# Patient Record
Sex: Female | Born: 1960 | Race: White | Hispanic: No | Marital: Single | Smoking: Never smoker
Health system: Southern US, Community
[De-identification: ages and names within clinical notes are randomized; demographics above are authoritative.]

---

## 2000-08-28 ENCOUNTER — Other Ambulatory Visit: Admission: RE | Admit: 2000-08-28 | Discharge: 2000-08-28 | Payer: Self-pay | Admitting: Obstetrics and Gynecology

## 2001-12-19 ENCOUNTER — Encounter: Payer: Self-pay | Admitting: Obstetrics and Gynecology

## 2001-12-19 ENCOUNTER — Encounter: Admission: RE | Admit: 2001-12-19 | Discharge: 2001-12-19 | Payer: Self-pay | Admitting: Obstetrics and Gynecology

## 2002-12-08 ENCOUNTER — Ambulatory Visit (HOSPITAL_COMMUNITY): Admission: RE | Admit: 2002-12-08 | Discharge: 2002-12-08 | Payer: Self-pay | Admitting: Obstetrics and Gynecology

## 2003-01-01 ENCOUNTER — Encounter: Admission: RE | Admit: 2003-01-01 | Discharge: 2003-01-01 | Payer: Self-pay | Admitting: Obstetrics and Gynecology

## 2003-09-29 ENCOUNTER — Other Ambulatory Visit: Admission: RE | Admit: 2003-09-29 | Discharge: 2003-09-29 | Payer: Self-pay | Admitting: Obstetrics and Gynecology

## 2004-01-21 ENCOUNTER — Encounter: Admission: RE | Admit: 2004-01-21 | Discharge: 2004-01-21 | Payer: Self-pay | Admitting: Obstetrics and Gynecology

## 2004-10-25 ENCOUNTER — Other Ambulatory Visit: Admission: RE | Admit: 2004-10-25 | Discharge: 2004-10-25 | Payer: Self-pay | Admitting: Obstetrics and Gynecology

## 2005-01-24 ENCOUNTER — Encounter: Admission: RE | Admit: 2005-01-24 | Discharge: 2005-01-24 | Payer: Self-pay | Admitting: Obstetrics and Gynecology

## 2006-01-26 ENCOUNTER — Encounter: Admission: RE | Admit: 2006-01-26 | Discharge: 2006-01-26 | Payer: Self-pay | Admitting: Obstetrics and Gynecology

## 2007-01-29 ENCOUNTER — Encounter: Admission: RE | Admit: 2007-01-29 | Discharge: 2007-01-29 | Payer: Self-pay | Admitting: Obstetrics and Gynecology

## 2008-02-18 ENCOUNTER — Encounter: Admission: RE | Admit: 2008-02-18 | Discharge: 2008-02-18 | Payer: Self-pay | Admitting: Obstetrics and Gynecology

## 2009-04-27 ENCOUNTER — Encounter: Admission: RE | Admit: 2009-04-27 | Discharge: 2009-04-27 | Payer: Self-pay | Admitting: Obstetrics and Gynecology

## 2009-04-28 ENCOUNTER — Other Ambulatory Visit: Admission: RE | Admit: 2009-04-28 | Discharge: 2009-04-28 | Payer: Self-pay | Admitting: Obstetrics and Gynecology

## 2010-01-23 ENCOUNTER — Encounter: Payer: Self-pay | Admitting: Obstetrics and Gynecology

## 2010-03-22 ENCOUNTER — Other Ambulatory Visit: Payer: Self-pay | Admitting: Obstetrics and Gynecology

## 2010-03-22 DIAGNOSIS — Z1231 Encounter for screening mammogram for malignant neoplasm of breast: Secondary | ICD-10-CM

## 2010-05-03 ENCOUNTER — Ambulatory Visit
Admission: RE | Admit: 2010-05-03 | Discharge: 2010-05-03 | Disposition: A | Discharge status: Home / Self Care | Payer: BC Managed Care – PPO | Source: Ambulatory Visit | Attending: Obstetrics and Gynecology | Admitting: Obstetrics and Gynecology

## 2010-05-03 DIAGNOSIS — Z1231 Encounter for screening mammogram for malignant neoplasm of breast: Secondary | ICD-10-CM

## 2011-06-28 ENCOUNTER — Other Ambulatory Visit: Payer: Self-pay | Admitting: *Deleted

## 2011-06-28 ENCOUNTER — Other Ambulatory Visit: Payer: Self-pay | Admitting: Obstetrics and Gynecology

## 2011-06-28 DIAGNOSIS — Z1231 Encounter for screening mammogram for malignant neoplasm of breast: Secondary | ICD-10-CM

## 2011-07-27 ENCOUNTER — Ambulatory Visit
Admission: RE | Admit: 2011-07-27 | Discharge: 2011-07-27 | Disposition: A | Discharge status: Home / Self Care | Payer: BC Managed Care – PPO | Source: Ambulatory Visit | Attending: Obstetrics and Gynecology | Admitting: Obstetrics and Gynecology

## 2011-07-27 DIAGNOSIS — Z1231 Encounter for screening mammogram for malignant neoplasm of breast: Secondary | ICD-10-CM

## 2012-08-28 ENCOUNTER — Other Ambulatory Visit: Payer: Self-pay

## 2012-08-28 DIAGNOSIS — Z1231 Encounter for screening mammogram for malignant neoplasm of breast: Secondary | ICD-10-CM

## 2012-09-20 ENCOUNTER — Ambulatory Visit: Payer: BC Managed Care – PPO

## 2012-09-25 ENCOUNTER — Ambulatory Visit
Admission: RE | Admit: 2012-09-25 | Discharge: 2012-09-25 | Disposition: A | Discharge status: Home / Self Care | Payer: BC Managed Care – PPO | Source: Ambulatory Visit

## 2012-09-25 DIAGNOSIS — Z1231 Encounter for screening mammogram for malignant neoplasm of breast: Secondary | ICD-10-CM

## 2013-11-04 ENCOUNTER — Other Ambulatory Visit: Payer: Self-pay

## 2013-11-04 DIAGNOSIS — Z1231 Encounter for screening mammogram for malignant neoplasm of breast: Secondary | ICD-10-CM

## 2013-11-20 ENCOUNTER — Ambulatory Visit
Admission: RE | Admit: 2013-11-20 | Discharge: 2013-11-20 | Disposition: A | Discharge status: Home / Self Care | Payer: BC Managed Care – PPO | Source: Ambulatory Visit

## 2013-11-20 ENCOUNTER — Encounter (INDEPENDENT_AMBULATORY_CARE_PROVIDER_SITE_OTHER): Payer: Self-pay

## 2013-11-20 DIAGNOSIS — Z1231 Encounter for screening mammogram for malignant neoplasm of breast: Secondary | ICD-10-CM

## 2014-01-06 ENCOUNTER — Other Ambulatory Visit: Payer: Self-pay | Admitting: Dermatology

## 2014-11-12 ENCOUNTER — Other Ambulatory Visit: Payer: Self-pay

## 2014-11-12 DIAGNOSIS — Z1231 Encounter for screening mammogram for malignant neoplasm of breast: Secondary | ICD-10-CM

## 2015-01-05 ENCOUNTER — Ambulatory Visit
Admission: RE | Admit: 2015-01-05 | Discharge: 2015-01-05 | Disposition: A | Discharge status: Home / Self Care | Payer: BLUE CROSS/BLUE SHIELD | Source: Ambulatory Visit

## 2015-01-05 DIAGNOSIS — Z1231 Encounter for screening mammogram for malignant neoplasm of breast: Secondary | ICD-10-CM

## 2015-12-08 ENCOUNTER — Other Ambulatory Visit: Payer: Self-pay | Admitting: *Deleted

## 2015-12-08 DIAGNOSIS — Z1231 Encounter for screening mammogram for malignant neoplasm of breast: Secondary | ICD-10-CM

## 2016-01-10 ENCOUNTER — Ambulatory Visit
Admission: RE | Admit: 2016-01-10 | Discharge: 2016-01-10 | Disposition: A | Discharge status: Home / Self Care | Payer: BLUE CROSS/BLUE SHIELD | Source: Ambulatory Visit | Attending: *Deleted | Admitting: *Deleted

## 2016-01-10 DIAGNOSIS — Z1231 Encounter for screening mammogram for malignant neoplasm of breast: Secondary | ICD-10-CM

## 2017-02-12 ENCOUNTER — Other Ambulatory Visit: Payer: Self-pay | Admitting: *Deleted

## 2017-02-12 DIAGNOSIS — Z139 Encounter for screening, unspecified: Secondary | ICD-10-CM

## 2017-04-24 ENCOUNTER — Ambulatory Visit
Admission: RE | Admit: 2017-04-24 | Discharge: 2017-04-24 | Disposition: A | Discharge status: Home / Self Care | Payer: BLUE CROSS/BLUE SHIELD | Source: Ambulatory Visit | Attending: *Deleted | Admitting: *Deleted

## 2017-04-24 DIAGNOSIS — Z139 Encounter for screening, unspecified: Secondary | ICD-10-CM

## 2018-09-06 ENCOUNTER — Other Ambulatory Visit: Payer: Self-pay | Admitting: Obstetrics & Gynecology

## 2018-09-06 DIAGNOSIS — Z1231 Encounter for screening mammogram for malignant neoplasm of breast: Secondary | ICD-10-CM

## 2018-11-05 ENCOUNTER — Other Ambulatory Visit: Payer: Self-pay

## 2018-11-05 ENCOUNTER — Ambulatory Visit
Admission: RE | Admit: 2018-11-05 | Discharge: 2018-11-05 | Disposition: A | Discharge status: Home / Self Care | Payer: BC Managed Care – PPO | Source: Ambulatory Visit | Attending: Obstetrics & Gynecology | Admitting: Obstetrics & Gynecology

## 2018-11-05 DIAGNOSIS — Z1231 Encounter for screening mammogram for malignant neoplasm of breast: Secondary | ICD-10-CM

## 2019-12-15 ENCOUNTER — Other Ambulatory Visit: Payer: Self-pay | Admitting: Obstetrics & Gynecology

## 2019-12-15 DIAGNOSIS — Z1231 Encounter for screening mammogram for malignant neoplasm of breast: Secondary | ICD-10-CM

## 2020-01-26 ENCOUNTER — Ambulatory Visit: Payer: BC Managed Care – PPO

## 2020-02-05 ENCOUNTER — Ambulatory Visit: Payer: BC Managed Care – PPO

## 2020-03-14 ENCOUNTER — Inpatient Hospital Stay (HOSPITAL_BASED_OUTPATIENT_CLINIC_OR_DEPARTMENT_OTHER)
Admission: EM | Admit: 2020-03-14 | Discharge: 2020-03-16 | DRG: 605 | Disposition: A | Discharge status: Home / Self Care | Payer: BC Managed Care – PPO | Attending: Internal Medicine | Admitting: Internal Medicine

## 2020-03-14 ENCOUNTER — Other Ambulatory Visit: Payer: Self-pay

## 2020-03-14 ENCOUNTER — Emergency Department (HOSPITAL_BASED_OUTPATIENT_CLINIC_OR_DEPARTMENT_OTHER): Payer: BC Managed Care – PPO | Admitting: Radiology

## 2020-03-14 ENCOUNTER — Encounter (HOSPITAL_BASED_OUTPATIENT_CLINIC_OR_DEPARTMENT_OTHER): Payer: Self-pay | Admitting: Obstetrics and Gynecology

## 2020-03-14 DIAGNOSIS — W5501XA Bitten by cat, initial encounter: Secondary | ICD-10-CM | POA: Diagnosis not present

## 2020-03-14 DIAGNOSIS — L03114 Cellulitis of left upper limb: Secondary | ICD-10-CM | POA: Diagnosis not present

## 2020-03-14 DIAGNOSIS — D696 Thrombocytopenia, unspecified: Secondary | ICD-10-CM | POA: Diagnosis present

## 2020-03-14 DIAGNOSIS — S61451A Open bite of right hand, initial encounter: Secondary | ICD-10-CM | POA: Diagnosis present

## 2020-03-14 DIAGNOSIS — S61459A Open bite of unspecified hand, initial encounter: Secondary | ICD-10-CM

## 2020-03-14 DIAGNOSIS — L03116 Cellulitis of left lower limb: Secondary | ICD-10-CM | POA: Diagnosis present

## 2020-03-14 DIAGNOSIS — Z20822 Contact with and (suspected) exposure to covid-19: Secondary | ICD-10-CM | POA: Diagnosis present

## 2020-03-14 DIAGNOSIS — R197 Diarrhea, unspecified: Secondary | ICD-10-CM | POA: Diagnosis not present

## 2020-03-14 DIAGNOSIS — D72829 Elevated white blood cell count, unspecified: Secondary | ICD-10-CM | POA: Diagnosis present

## 2020-03-14 DIAGNOSIS — Z9109 Other allergy status, other than to drugs and biological substances: Secondary | ICD-10-CM

## 2020-03-14 DIAGNOSIS — S61452A Open bite of left hand, initial encounter: Secondary | ICD-10-CM | POA: Diagnosis not present

## 2020-03-14 DIAGNOSIS — E669 Obesity, unspecified: Secondary | ICD-10-CM | POA: Diagnosis present

## 2020-03-14 DIAGNOSIS — Z885 Allergy status to narcotic agent status: Secondary | ICD-10-CM | POA: Diagnosis not present

## 2020-03-14 DIAGNOSIS — L03115 Cellulitis of right lower limb: Secondary | ICD-10-CM | POA: Diagnosis present

## 2020-03-14 DIAGNOSIS — Z6835 Body mass index (BMI) 35.0-35.9, adult: Secondary | ICD-10-CM | POA: Diagnosis not present

## 2020-03-14 LAB — CBC WITH DIFFERENTIAL/PLATELET
Abs Immature Granulocytes: 0.03 10*3/uL (ref 0.00–0.07)
Basophils Absolute: 0.1 10*3/uL (ref 0.0–0.1)
Basophils Relative: 1 %
Eosinophils Absolute: 0.2 10*3/uL (ref 0.0–0.5)
Eosinophils Relative: 2 %
HCT: 41.8 % (ref 36.0–46.0)
Hemoglobin: 13.7 g/dL (ref 12.0–15.0)
Immature Granulocytes: 0 %
Lymphocytes Relative: 23 %
Lymphs Abs: 2.7 10*3/uL (ref 0.7–4.0)
MCH: 28.5 pg (ref 26.0–34.0)
MCHC: 32.8 g/dL (ref 30.0–36.0)
MCV: 86.9 fL (ref 80.0–100.0)
Monocytes Absolute: 0.9 10*3/uL (ref 0.1–1.0)
Monocytes Relative: 7 %
Neutro Abs: 8 10*3/uL — ABNORMAL HIGH (ref 1.7–7.7)
Neutrophils Relative %: 67 %
Platelets: 254 10*3/uL (ref 150–400)
RBC: 4.81 MIL/uL (ref 3.87–5.11)
RDW: 13.5 % (ref 11.5–15.5)
WBC: 11.8 10*3/uL — ABNORMAL HIGH (ref 4.0–10.5)
nRBC: 0 % (ref 0.0–0.2)

## 2020-03-14 LAB — COMPREHENSIVE METABOLIC PANEL
ALT: 16 U/L (ref 0–44)
AST: 15 U/L (ref 15–41)
Albumin: 4.6 g/dL (ref 3.5–5.0)
Alkaline Phosphatase: 75 U/L (ref 38–126)
Anion gap: 8 (ref 5–15)
BUN: 13 mg/dL (ref 6–20)
CO2: 27 mmol/L (ref 22–32)
Calcium: 9.9 mg/dL (ref 8.9–10.3)
Chloride: 101 mmol/L (ref 98–111)
Creatinine, Ser: 0.91 mg/dL (ref 0.44–1.00)
GFR, Estimated: >60 mL/min (ref >60)
Glucose, Bld: 106 mg/dL — ABNORMAL HIGH (ref 70–99)
Potassium: 3.9 mmol/L (ref 3.5–5.1)
Sodium: 136 mmol/L (ref 135–145)
Total Bilirubin: 1 mg/dL (ref 0.3–1.2)
Total Protein: 7.5 g/dL (ref 6.5–8.1)

## 2020-03-14 LAB — RESP PANEL BY RT-PCR (FLU A&B, COVID) ARPGX2
Influenza A by PCR: NEGATIVE
Influenza B by PCR: NEGATIVE
SARS Coronavirus 2 by RT PCR: NEGATIVE

## 2020-03-14 LAB — LACTIC ACID, PLASMA: Lactic Acid, Venous: 0.6 mmol/L (ref 0.5–1.9)

## 2020-03-14 MED ORDER — DOXYLAMINE SUCCINATE (SLEEP) 25 MG PO TABS
25.0000 mg | ORAL_TABLET | Freq: Once | ORAL | Status: AC
  Administered 2020-03-14: 25 mg via ORAL
  Filled 2020-03-14: qty 1

## 2020-03-14 MED ORDER — SODIUM CHLORIDE 0.45 % IV SOLN: INTRAVENOUS | Status: DC

## 2020-03-14 MED ORDER — SODIUM CHLORIDE 0.9 % IV SOLN
3.0000 g | Freq: Three times a day (TID) | INTRAVENOUS | Status: DC
  Administered 2020-03-14 – 2020-03-16 (×5): 3 g via INTRAVENOUS
  Filled 2020-03-14 (×4): qty 8
  Filled 2020-03-14 (×2): qty 3
  Filled 2020-03-14: qty 8

## 2020-03-14 MED ORDER — SODIUM CHLORIDE 0.9 % IV SOLN
3.0000 g | Freq: Once | INTRAVENOUS | Status: AC
  Administered 2020-03-14: 3 g via INTRAVENOUS
  Filled 2020-03-14: qty 8

## 2020-03-14 MED ORDER — ENOXAPARIN SODIUM 40 MG/0.4ML ~~LOC~~ SOLN
40.0000 mg | SUBCUTANEOUS | Status: DC
  Administered 2020-03-14 – 2020-03-15 (×2): 40 mg via SUBCUTANEOUS
  Filled 2020-03-14 (×2): qty 0.4

## 2020-03-14 MED ORDER — ACETAMINOPHEN 650 MG RE SUPP: 650.0000 mg | Freq: Four times a day (QID) | RECTAL | Status: DC | PRN

## 2020-03-14 MED ORDER — ONDANSETRON HCL 4 MG/2ML IJ SOLN: 4.0000 mg | Freq: Four times a day (QID) | INTRAMUSCULAR | Status: DC | PRN

## 2020-03-14 MED ORDER — SODIUM CHLORIDE 0.9 % IV SOLN
100.0000 mg | Freq: Two times a day (BID) | INTRAVENOUS | Status: DC
  Administered 2020-03-14 – 2020-03-16 (×4): 100 mg via INTRAVENOUS
  Filled 2020-03-14 (×6): qty 100

## 2020-03-14 MED ORDER — LACTATED RINGERS IV SOLN: INTRAVENOUS | Status: DC

## 2020-03-14 MED ORDER — MORPHINE SULFATE (PF) 2 MG/ML IV SOLN: 2.0000 mg | INTRAVENOUS | Status: DC | PRN

## 2020-03-14 MED ORDER — ACETAMINOPHEN 325 MG PO TABS: 650.0000 mg | ORAL_TABLET | Freq: Four times a day (QID) | ORAL | Status: DC | PRN

## 2020-03-14 MED ORDER — ONDANSETRON HCL 4 MG PO TABS: 4.0000 mg | ORAL_TABLET | Freq: Four times a day (QID) | ORAL | Status: DC | PRN

## 2020-03-14 NOTE — ED Notes (Signed)
Pt given another pillow - pt has a pillow for each arm and her head. Pt given update on room - still waiting for room to be available and Hospital was [facility] and would be provided with driving directions. Ok to move fingers

## 2020-03-14 NOTE — Plan of Care (Signed)
  Problem: Education: Goal: Knowledge of General Education information will improve Description Including pain rating scale, medication(s)/side effects and non-pharmacologic comfort measures Outcome: Progressing   

## 2020-03-14 NOTE — ED Notes (Addendum)
Spoke with Dewayne Hatch in pharmacy r/t ABX and the 2 hour infusion - questioning if needs to be this long. Pt is needs to POV to [facility] for admit. Advised that they will call back

## 2020-03-14 NOTE — ED Provider Notes (Addendum)
[facility] EMERGENCY DEPT Provider Note   CSN: [id number] Arrival date & time: 03/14/20  1229     History Chief Complaint  Patient presents with  . Animal Bite    Angel Ford is a 60 y.o. female.  HPI Patient was trying to medicate her cat yesterday at 9 AM.  Her cat is up-to-date on all immunizations.  She was bitten several times.  She was bitten on both hands.  Puncture wounds were concentrated over the index finger knuckles on both hands.  She was also scratched as well.  Patient went to urgent care yesterday and was prescribed Augmentin.  She reports she took Augmentin 875mg  yesterday afternoon, yesterday evening and this morning.  She reports this morning however the swelling has gotten much larger on her left hand and it is now painful and stiff to close.  She reports yesterday she could close the hand much more easily.  She has also developed a red streak going up her left forearm.  No fevers, no chills, no general malaise.  Patient is otherwise healthy without medical problems.  Last tetanus 5 and half years ago.    History reviewed. No pertinent past medical history.  There are no problems to display for this patient.   History reviewed. No pertinent surgical history.   OB History   No obstetric history on file.     No family history on file.  Social History   Tobacco Use  . Smoking status: Never Smoker  . Smokeless tobacco: Never Used  Vaping Use  . Vaping Use: Never used  Substance Use Topics  . Alcohol use: Yes    Comment: Social  . Drug use: Not Currently    Home Medications Prior to Admission medications   Not on File    Allergies    Patient has no known allergies.  Review of Systems   Review of Systems 10 systems reviewed and negative except as per HPI Physical Exam Updated Vital Signs BP (!) 142/91   Pulse 69   Temp 98.5 F (36.9 C) (Oral)   Resp 18   SpO2 100%   Physical Exam Constitutional:      Comments:  Alert nontoxic clinically well in appearance.  No respiratory distress  HENT:     Head: Normocephalic and atraumatic.     Mouth/Throat:     Pharynx: Oropharynx is clear.  Eyes:     Extraocular Movements: Extraocular movements intact.  Cardiovascular:     Rate and Rhythm: Normal rate and regular rhythm.  Pulmonary:     Effort: Pulmonary effort is normal.     Breath sounds: Normal breath sounds.  Abdominal:     General: There is no distension.     Palpations: Abdomen is soft.     Tenderness: There is no abdominal tenderness. There is no guarding.  Musculoskeletal:     Comments: Erythema and swelling at the dorsum of the left hand greatest over the first metacarpal.  2 puncture wounds present.  Patient can flex and extend all digits.  She is experiencing increased pain and tightness with forced flexion of the index finger.  Red streaking is seen in the attached image.  Right hand also has puncture and swelling over the second metacarpal.  Patient able to flex and extend with normal range of motion.             ED Results / Procedures / Treatments   Labs (all labs ordered are listed, but only abnormal results  are displayed) Labs Reviewed  COMPREHENSIVE METABOLIC PANEL - Abnormal; Notable for the following components:      Result Value   Glucose, Bld 106 (*)    All other components within normal limits  CBC WITH DIFFERENTIAL/PLATELET - Abnormal; Notable for the following components:   WBC 11.8 (*)    Neutro Abs 8.0 (*)    All other components within normal limits  CULTURE, BLOOD (ROUTINE X 2)  CULTURE, BLOOD (ROUTINE X 2)  LACTIC ACID, PLASMA  LACTIC ACID, PLASMA    EKG None  Radiology DG Hand Complete Left  Result Date: 03/14/2020 CLINICAL DATA:  Cat bite EXAM: LEFT HAND - COMPLETE 3+ VIEW COMPARISON:  None. FINDINGS: Frontal, oblique, and lateral views were obtained. No fracture or dislocation. Joint spaces appear normal. No erosive change or bony destruction. No  radiopaque foreign body. IMPRESSION: No fracture or dislocation. No radiopaque foreign body. No erosive change or bony destruction. No appreciable arthropathic change. Electronically Signed   By: Bretta Bang III M.D.   On: 03/14/2020 13:24   DG Hand Complete Right  Result Date: 03/14/2020 CLINICAL DATA:  Cat bite EXAM: RIGHT HAND - COMPLETE 3+ VIEW COMPARISON:  None. FINDINGS: Frontal, oblique, and lateral views were obtained. No fracture or dislocation. No radiopaque foreign body. No joint space narrowing or erosion. No bony destruction. IMPRESSION: No fracture or dislocation. No erosive change or bony destruction. No radiopaque foreign body. No evident arthropathy. Electronically Signed   By: Bretta Bang III M.D.   On: 03/14/2020 13:23    Procedures Procedures  Angiocath insertion Performed by: Arby Barrette  Consent: Verbal consent obtained. Risks and benefits: risks, benefits and alternatives were discussed Time out: Immediately prior to procedure a "time out" was called to verify the correct patient, procedure, equipment, support staff and site/side marked as required.  Preparation: Patient was prepped and draped in the usual sterile fashion.  Vein Location: right AC  Yes Ultrasound Guided  Gauge: 20 short  Normal blood return and flush without difficulty Patient tolerance: Patient tolerated the procedure well with no immediate complications.   Medications Ordered in ED Medications  lactated ringers infusion ( Intravenous New Bag/Given 03/14/20 1321)  Ampicillin-Sulbactam (UNASYN) 3 g in sodium chloride 0.9 % 100 mL IVPB (3 g Intravenous New Bag/Given 03/14/20 1322)    ED Course  I have reviewed the triage vital signs and the nursing notes.  Pertinent labs & imaging results that were available during my care of the patient were reviewed by me and considered in my medical decision making (see chart for details).    MDM Rules/Calculators/A&P                          Patient presents with cat bites to both hands.  She has had 3 doses of Augmentin 875 in the past 20 hours.  Today, she had significant increasing in the swelling on the dorsum of the left hand as well as the development of the streaking up the forearm and now stiffness and pain with closing her fist all the way.  With high risk of tenosynovitis and advancing infection from cat bite, lab work obtained.  Patient has mild leukocytosis with no lactic acidosis.  X-rays do not show any retained foreign body, soft tissue gas or fracture.  Patient is started on Unasyn IV.  We will consult hand surgery for definitive management.  Patient request consult be placed to [facility] facility due to proximity  to her home.  [facility] does not have a Hydrographic surveyor on staff for today.  No bed availability at [facility].  I reviewed the possibility of consulting with [facility] if patient wanted to be in [place] closer to home, she declines that option.  She is agreeable to staying within the [facility] health care system  Consult: Reviewed with Dr. Dion Saucier.  Request admission to hospitalist service, keep patient n.p.o. for possible surgical intervention this evening.  Consult: Reviewed with Dr. Ophelia Charter for admission.  She is added doxycycline to broaden coverage.  All results and plans reviewed with the patient.  She is agreeable to proceeding with transfer for admission for IV antibiotics and aware of potential surgical debridement.  Final Clinical Impression(s) / ED Diagnoses Final diagnoses:  Cat bite of left hand, initial encounter  Cat bite of right hand, initial encounter  Cellulitis of left upper extremity    Rx / DC Orders ED Discharge Orders    None       Arby Barrette, MD 03/14/20 1412    Arby Barrette, MD 03/14/20 1545

## 2020-03-14 NOTE — Consult Note (Signed)
ORTHOPAEDIC CONSULTATION  REQUESTING PHYSICIAN: Rometta Emery, MD  Chief Complaint: Bilateral hand cat bites  HPI: Angel Ford is a 60 y.o. female who complains of increasing swelling and redness and pain over bilateral hands after she had both of her hands bit by her cat.  She was trying to feed her cat medicine, and this was the seventh day, and apparently the cat had had enough.  She began to notice the redness about 24 hours ago, taking Augmentin, but things have gradually gotten worse and she noted streaking going up her arm to the level of her elbow.  She was transferred from [employer] [facility].  She denies fevers or chills, never had infections like this before.  History reviewed. No pertinent past medical history. History reviewed. No pertinent surgical history. Social History   Socioeconomic History  . Marital status: Single    Spouse name: Not on file  . Number of children: Not on file  . Years of education: Not on file  . Highest education level: Not on file  Occupational History  . Not on file  Tobacco Use  . Smoking status: Never Smoker  . Smokeless tobacco: Never Used  Vaping Use  . Vaping Use: Never used  Substance and Sexual Activity  . Alcohol use: Yes    Comment: Social  . Drug use: Not Currently  . Sexual activity: Not Currently  Other Topics Concern  . Not on file  Social History Narrative  . Not on file   Social Determinants of Health   Financial Resource Strain: Not on file  Food Insecurity: Not on file  Transportation Needs: Not on file  Physical Activity: Not on file  Stress: Not on file  Social Connections: Not on file   No family history on file. Allergies  Allergen Reactions  . Wound Dressing Adhesive Rash    Please use paper tape or non-adhesive wrap  . Codeine Nausea And Vomiting     Positive ROS: All other systems have been reviewed and were otherwise negative with the exception of those mentioned in the HPI and  as above.  Physical Exam: General: Alert, no acute distress Cardiovascular: No pedal edema Respiratory: No cyanosis, no use of accessory musculature GI: No organomegaly, abdomen is soft and non-tender Skin: She has a couple of punctate areas over the dorsum of the left and right hands where she had abrasions and cuts from this, the most significant of which was over the long metacarpal phalangeal joint. Neurologic: Sensation intact distally Psychiatric: Patient is competent for consent with normal mood and affect Lymphatic: No axillary or cervical lymphadenopathy  MUSCULOSKELETAL: She has no pain in the palm of either hand, and she has moderate soft tissue swelling with cellulitic changes in the left and right hands.  She has streaking up to the proximal third of the forearm on the left arm.  All fingers flex extend and abduct.  Assessment: Principal Problem:   Cat bite of hand, initial encounter Active Problems:   Leucocytosis  Bilateral hand cellulitis  Plan: This is an acute and potentially problematic issue, she has had worsening despite oral medications, and a fairly aggressive course.  Plan for splinting of the left upper extremity, as this is the most involved, and also a mission splint sling elevator.  I do not see signs for sepsis of the metacarpophalangeal joints clinically, nor do I see flexor tendon synovitis.  I do not think she is going to need surgery.  We  will monitor clinically response to the antibiotics, and consider advanced imaging down the line if she is worsening clinically.  We will follow.  Teryl Lucy, MD    Eulas Post, MD Cell [phone]   03/14/2020 8:55 PM

## 2020-03-14 NOTE — ED Triage Notes (Signed)
Patient reports to the ER after being bitten by her cat yesterday morning at 0900. Patient stated she went to the UC and was prescribed Augmentin. Patient reports she took x3 doses and had an increase in swelling last night. Patient reports she noted a red streak up her arm today and has obvious swelling to left hand.

## 2020-03-14 NOTE — H&P (Signed)
History and Physical   Angel Ford JKD:[id number] DOB: 11-24-1960 DOA: 03/14/2020  Referring MD/NP/PA: Dr. Clarice Pole  PCP: Medicine, [facility] Internal   Outpatient Specialists: None  Patient coming from: Home  Chief Complaint: Animal bite  HPI: Angel Ford is a 60 y.o. female with medical history significant of no significant past medical history who was bitten by her cat on both hands last night.  The cat bit her several times.  Patient has been up-to-date with her tetanus and the cat also has been up-to-date with her immunizations.  Most of the punctures is around the index finger knuckles and the back of both hands.  Multiple scratches also in the area.  She went to urgent care yesterday where she was given Augmentin.  She is taking at least 3 doses so far but the swelling is getting much larger especially on the left hand is painful and stiff and not able to close her fist.  She also noted an red streak going more or less up of her left forearm and she is worried she came to the ER.  She was seen and evaluated and is admitted for IV antibiotics.  ED Course: Temperature 98.5 blood pressure 147/89, pulse 77 respirate of 19 oxygen sat 100% on room air.  White count is 11.8 the rest of the CBC is within normal.  Glucose 106 otherwise chemistry all within normal.  X-ray of the hands bilaterally showed no acute findings.  Patient being admitted for IV antibiotics.  Review of Systems: As per HPI otherwise 10 point review of systems negative.    History reviewed. No pertinent past medical history.  History reviewed. No pertinent surgical history.   reports that she has never smoked. She has never used smokeless tobacco. She reports current alcohol use. She reports previous drug use.  Allergies  Allergen Reactions  . Codeine Nausea And Vomiting  . Wound Dressing Adhesive Rash    No family history on file.   Prior to Admission medications   Not on File     Physical Exam: Vitals:   03/14/20 1246 03/14/20 1352 03/14/20 1430 03/14/20 1810  BP: (!) 139/97 (!) 142/91 (!) 147/89 (!) 131/94  Pulse: 73 69 77 67  Resp: 19 18 14 16   Temp: 98.5 F (36.9 C)   98.2 F (36.8 C)  TempSrc: Oral   Oral  SpO2: 100% 100% 100% 100%  Weight:    88.5 kg  Height:    5\' 2"  (1.575 m)      Constitutional: Acutely ill looking no distress Vitals:   03/14/20 1246 03/14/20 1352 03/14/20 1430 03/14/20 1810  BP: (!) 139/97 (!) 142/91 (!) 147/89 (!) 131/94  Pulse: 73 69 77 67  Resp: 19 18 14 16   Temp: 98.5 F (36.9 C)   98.2 F (36.8 C)  TempSrc: Oral   Oral  SpO2: 100% 100% 100% 100%  Weight:    88.5 kg  Height:    5\' 2"  (1.575 m)   Eyes: PERRL, lids and conjunctivae normal ENMT: Mucous membranes are moist. Posterior pharynx clear of any exudate or lesions.Normal dentition.  Neck: normal, supple, no masses, no thyromegaly Respiratory: clear to auscultation bilaterally, no wheezing, no crackles. Normal respiratory effort. No accessory muscle use.  Cardiovascular: Regular rate and rhythm, no murmurs / rubs / gallops. No extremity edema. 2+ pedal pulses. No carotid bruits.  Abdomen: no tenderness, no masses palpated. No hepatosplenomegaly. Bowel sounds positive.  Musculoskeletal: no clubbing / cyanosis. No  joint deformity upper and lower extremities. Good ROM, swelling of the left hand especially greatest over the first metacarpal.  Multiple puncture wounds with streaks at the back of the hand.  Warm tender.  Right hand puncture also with puncture wounds over the second metacarpal.  Multiple streaks going up the arm.   Skin: As per above Neurologic: CN 2-12 grossly intact. Sensation intact, DTR normal. Strength 5/5 in all 4.  Psychiatric: Normal judgment and insight. Alert and oriented x 3. Normal mood.     Labs on Admission: I have personally reviewed following labs and imaging studies  CBC: Recent Labs  Lab 03/14/20 1315  WBC 11.8*  NEUTROABS  8.0*  HGB 13.7  HCT 41.8  MCV 86.9  PLT 254   Basic Metabolic Panel: Recent Labs  Lab 03/14/20 1315  NA 136  K 3.9  CL 101  CO2 27  GLUCOSE 106*  BUN 13  CREATININE 0.91  CALCIUM 9.9   GFR: Estimated Creatinine Clearance: 68.8 mL/min (by C-G formula based on SCr of 0.91 mg/dL). Liver Function Tests: Recent Labs  Lab 03/14/20 1315  AST 15  ALT 16  ALKPHOS 75  BILITOT 1.0  PROT 7.5  ALBUMIN 4.6   No results for input(s): LIPASE, AMYLASE in the last 168 hours. No results for input(s): AMMONIA in the last 168 hours. Coagulation Profile: No results for input(s): INR, PROTIME in the last 168 hours. Cardiac Enzymes: No results for input(s): CKTOTAL, CKMB, CKMBINDEX, TROPONINI in the last 168 hours. BNP (last 3 results) No results for input(s): PROBNP in the last 8760 hours. HbA1C: No results for input(s): HGBA1C in the last 72 hours. CBG: No results for input(s): GLUCAP in the last 168 hours. Lipid Profile: No results for input(s): CHOL, HDL, LDLCALC, TRIG, CHOLHDL, LDLDIRECT in the last 72 hours. Thyroid Function Tests: No results for input(s): TSH, T4TOTAL, FREET4, T3FREE, THYROIDAB in the last 72 hours. Anemia Panel: No results for input(s): VITAMINB12, FOLATE, FERRITIN, TIBC, IRON, RETICCTPCT in the last 72 hours. Urine analysis: No results found for: COLORURINE, APPEARANCEUR, LABSPEC, PHURINE, GLUCOSEU, HGBUR, BILIRUBINUR, KETONESUR, PROTEINUR, UROBILINOGEN, NITRITE, LEUKOCYTESUR Sepsis Labs: @LABRCNTIP (procalcitonin:4,lacticidven:4) ) Recent Results (from the past 240 hour(s))  Resp Panel by RT-PCR (Flu A&B, Covid) Nasopharyngeal Swab     Status: None   Collection Time: 03/14/20  2:47 PM   Specimen: Nasopharyngeal Swab; Nasopharyngeal(NP) swabs in vial transport medium  Result Value Ref Range Status   SARS Coronavirus 2 by RT PCR NEGATIVE NEGATIVE Final   Influenza A by PCR NEGATIVE NEGATIVE Final   Influenza B by PCR NEGATIVE NEGATIVE Final      Radiological Exams on Admission: DG Hand Complete Left  Result Date: 03/14/2020 CLINICAL DATA:  Cat bite EXAM: LEFT HAND - COMPLETE 3+ VIEW COMPARISON:  None. FINDINGS: Frontal, oblique, and lateral views were obtained. No fracture or dislocation. Joint spaces appear normal. No erosive change or bony destruction. No radiopaque foreign body. IMPRESSION: No fracture or dislocation. No radiopaque foreign body. No erosive change or bony destruction. No appreciable arthropathic change. Electronically Signed   By: 03/16/2020 III M.D.   On: 03/14/2020 13:24   DG Hand Complete Right  Result Date: 03/14/2020 CLINICAL DATA:  Cat bite EXAM: RIGHT HAND - COMPLETE 3+ VIEW COMPARISON:  None. FINDINGS: Frontal, oblique, and lateral views were obtained. No fracture or dislocation. No radiopaque foreign body. No joint space narrowing or erosion. No bony destruction. IMPRESSION: No fracture or dislocation. No erosive change or bony destruction. No radiopaque  foreign body. No evident arthropathy. Electronically Signed   By: Bretta Bang III M.D.   On: 03/14/2020 13:23      Assessment/Plan Principal Problem:   Cat bite of hand, initial encounter Active Problems:   Leucocytosis     #1 cellulitis of the has left more than right: Following cat bite.  Patient may have complex infections including possible Bartonella.  At this point she has been on Augmentin.  Now started on Unasyn.  Also add doxycycline.  Follow blood cultures.  Monitor hand and elevate.  Supportive care.  #2 cat bite: Patient has been updated with her tetanus.  Cat is also immunized up-to-date.  Continue supportive care.  #3 leukocytosis: Most likely due to cellulitis.  Follow closely   DVT prophylaxis: Lovenox Code Status: Full code Family Communication: No family at bedside Disposition Plan: Home Consults called:  orthopedics Dr. Dion Saucier aware Admission status: Inpatient  Severity of Illness: The appropriate patient  status for this patient is INPATIENT. Inpatient status is judged to be reasonable and necessary in order to provide the required intensity of service to ensure the patient's safety. The patient's presenting symptoms, physical exam findings, and initial radiographic and laboratory data in the context of their chronic comorbidities is felt to place them at high risk for further clinical deterioration. Furthermore, it is not anticipated that the patient will be medically stable for discharge from the hospital within 2 midnights of admission. The following factors support the patient status of inpatient.   " The patient's presenting symptoms include cat bite. " The worrisome physical exam findings include swollen hands bilaterally. " The initial radiographic and laboratory data are worrisome because of no findings on the bone. " The chronic co-morbidities include no significant history.   * I certify that at the point of admission it is my clinical judgment that the patient will require inpatient hospital care spanning beyond 2 midnights from the point of admission due to high intensity of service, high risk for further deterioration and high frequency of surveillance required.Lonia Blood MD [facility] Pager [phone]  If 7PM-7AM, please contact night-coverage [email or web address] Password [email or web address]  03/14/2020, 7:24 PM

## 2020-03-14 NOTE — ED Notes (Signed)
Patient given a pillow to support arm during IV infusion. Lights dimmed per patient request. Patient denies needs at this time. Patient oriented to call bell and remote for TV.

## 2020-03-14 NOTE — Progress Notes (Signed)
Patient without significant medical history presenting with a cat bite from her pet.  Has taken 3 doses of Augmentin without improvement.  Increasing redness/swelling, stiffness and pain with flexion so concern for tenosynovitis.  Dr. Dion Saucier recommends continued antibiotics, NPO in case she needs to go to the OR.  Will add Doxy to the Unasyn for scratches to cover Bartonella.  Will admit to Med Surg at this time.   Georgana Curio. M.D.

## 2020-03-14 NOTE — ED Notes (Signed)
RN attempted to stick for second set of blood cultures before starting antibiotics without success. Patient reports she is a hard stick. Patient requests not to be stuck again as she reports she does not want to be stuck again at this time.

## 2020-03-14 NOTE — ED Notes (Signed)
IV wrapped lightly with Co-band for transport. Patient informed to go straight to [facility]. Patient given map and reports called. Patient reports understanding.

## 2020-03-14 NOTE — Consult Note (Addendum)
ORTHOPAEDIC CONSULTATION  REQUESTING PHYSICIAN: Rometta Emery, MD  Chief Complaint: bilateral hand cat bites  HPI: Angel Ford is a 60 y.o. female who who was bitten by her cat several times on both hands yesterday.  Patient states she went to urgent care yesterday, where she was seen and discharged with Augmentin.  She had taken 3 doses this morning when she came to the emergency department because her swelling had gotten worse and her left hand was more painful and stiff.  She also noticed streaking going up her leg by lateral arms.  Patient is up-to-date on her tetanus her cat is up-to-date on immunizations.  States pain is worse at her left index finger knuckles and the dorsal aspect of both hands.  Denies any recent fevers or chills.  Paresthesias nausea or vomiting.  History reviewed. No pertinent past medical history. History reviewed. No pertinent surgical history. Social History   Socioeconomic History  . Marital status: Single    Spouse name: Not on file  . Number of children: Not on file  . Years of education: Not on file  . Highest education level: Not on file  Occupational History  . Not on file  Tobacco Use  . Smoking status: Never Smoker  . Smokeless tobacco: Never Used  Vaping Use  . Vaping Use: Never used  Substance and Sexual Activity  . Alcohol use: Yes    Comment: Social  . Drug use: Not Currently  . Sexual activity: Not Currently  Other Topics Concern  . Not on file  Social History Narrative  . Not on file   Social Determinants of Health   Financial Resource Strain: Not on file  Food Insecurity: Not on file  Transportation Needs: Not on file  Physical Activity: Not on file  Stress: Not on file  Social Connections: Not on file   No family history on file. Allergies  Allergen Reactions  . Wound Dressing Adhesive Rash    Please use paper tape or non-adhesive wrap  . Codeine Nausea And Vomiting     Positive ROS: All other systems  have been reviewed and were otherwise negative with the exception of those mentioned in the HPI and as above.  Physical Exam: General: Alert, no acute distress Cardiovascular: No pedal edema Respiratory: No cyanosis, no use of accessory musculature GI: No organomegaly, abdomen is soft and non-tender Skin: Please see photos below Neurologic: Sensation intact distally Psychiatric: Patient is competent for consent with normal mood and affect Lymphatic: No axillary or cervical lymphadenopathy  MUSCULOSKELETAL: Patient able to flex extend abduct all fingers of the left hand.  Marked erythema and mild edema over the dorsal aspect of the left hand with streaking extending up the forearm.  2 punctate bite marks over the dorsal aspect of the long finger MCP joint.  No fusiform swelling of any digit.  Mild pain with flexion and extension of index and long fingers of left hand.  Distal sensation is intact bilaterally.  + radial pulse bilaterally.  Capillary refill is intact to all fingers bilaterally.  Right hand has scratches on the dorsal aspect of right hand with bites over the MCP joint of the index finger.  Able to flex extend abduct all fingers of the right hand.  No fusiform swelling.  Mild edema and erythema on the dorsal aspect of the right hand.  Streaking extending up the forearm.              Assessment/Plan: Cat bite of bilateral  hands with cellulitis -At this time due to lack of fusiform swelling, patient can actively flex and extend all fingers of bilateral hands, no abscess formation that I can appreciate. I do not think she is in need of an operative irrigation and debridement - encourage patient to elevate bilateral hands - continue antibiotics as per primary - will continue to follow for worsening symptoms  Armida Sans, PA-C    03/14/2020 8:47 PM

## 2020-03-14 NOTE — ED Notes (Signed)
MD at bedside. 

## 2020-03-15 DIAGNOSIS — S61459A Open bite of unspecified hand, initial encounter: Secondary | ICD-10-CM | POA: Diagnosis not present

## 2020-03-15 DIAGNOSIS — W5501XA Bitten by cat, initial encounter: Secondary | ICD-10-CM | POA: Diagnosis not present

## 2020-03-15 LAB — COMPREHENSIVE METABOLIC PANEL
ALT: 15 U/L (ref 0–44)
AST: 15 U/L (ref 15–41)
Albumin: 3 g/dL — ABNORMAL LOW (ref 3.5–5.0)
Alkaline Phosphatase: 61 U/L (ref 38–126)
Anion gap: 7 (ref 5–15)
BUN: 8 mg/dL (ref 6–20)
CO2: 26 mmol/L (ref 22–32)
Calcium: 8.9 mg/dL (ref 8.9–10.3)
Chloride: 105 mmol/L (ref 98–111)
Creatinine, Ser: 0.88 mg/dL (ref 0.44–1.00)
GFR, Estimated: >60 mL/min (ref >60)
Glucose, Bld: 93 mg/dL (ref 70–99)
Potassium: 3.8 mmol/L (ref 3.5–5.1)
Sodium: 138 mmol/L (ref 135–145)
Total Bilirubin: 1.2 mg/dL (ref 0.3–1.2)
Total Protein: 5.7 g/dL — ABNORMAL LOW (ref 6.5–8.1)

## 2020-03-15 LAB — CBC
HCT: 36.8 % (ref 36.0–46.0)
Hemoglobin: 12.8 g/dL (ref 12.0–15.0)
MCH: 29.6 pg (ref 26.0–34.0)
MCHC: 34.8 g/dL (ref 30.0–36.0)
MCV: 85.2 fL (ref 80.0–100.0)
Platelets: 105 10*3/uL — ABNORMAL LOW (ref 150–400)
RBC: 4.32 MIL/uL (ref 3.87–5.11)
RDW: 13.3 % (ref 11.5–15.5)
WBC: 7.7 10*3/uL (ref 4.0–10.5)
nRBC: 0 % (ref 0.0–0.2)

## 2020-03-15 MED ORDER — DOXYLAMINE SUCCINATE (SLEEP) 25 MG PO TABS
25.0000 mg | ORAL_TABLET | Freq: Once | ORAL | Status: AC
  Administered 2020-03-15: 25 mg via ORAL
  Filled 2020-03-15: qty 1

## 2020-03-15 MED ORDER — SACCHAROMYCES BOULARDII 250 MG PO CAPS
250.0000 mg | ORAL_CAPSULE | Freq: Two times a day (BID) | ORAL | Status: DC
  Administered 2020-03-15 – 2020-03-16 (×3): 250 mg via ORAL
  Filled 2020-03-15 (×3): qty 1

## 2020-03-15 NOTE — Progress Notes (Signed)
[facility] paged and requested [employer] for patient request to help her sleep. Ilean Skill LPN

## 2020-03-15 NOTE — Progress Notes (Signed)
Subjective:  Denies pain at rest. Mild-moderate pain at first and second MCP joints. No pain at right hand. Denies fever or chills. No other complaints.   Objective:  PE: VITALS:   Vitals:   03/14/20 1430 03/14/20 1810 03/14/20 2139 03/15/20 0633  BP: (!) 147/89 (!) 131/94 (!) 142/82 103/72  Pulse: 77 67 (!) 59 67  Resp: 14 16 16 16   Temp:  98.2 F (36.8 C) 98.4 F (36.9 C) 98.2 F (36.8 C)  TempSrc:  Oral Oral Oral  SpO2: 100% 100% 98% 98%  Weight:  88.5 kg    Height:  5\' 2"  (1.575 m)     General: well appearing female, laying in bed, in no acute distress MSK: Continued erythema of right hand with mild edema, looks to be improved from exam yesterday. Able to flex, extend, and abduct all fingers of right hand without pain. Continued edema and erythema of left hand, especially over index and long finger MCP joints. Continued streaking up left arm. Does not look to be changed from exam yesterday. Able to flex and extend all fingers but flexion mildly limited due to swelling. Distal sensation intact to all fingers of bilateral hands. Capillary refill intact to all fingers of bilateral hands.   LABS  Results for orders placed or performed during the hospital encounter of 03/14/20 (from the past 24 hour(s))  Comprehensive metabolic panel     Status: Abnormal   Collection Time: 03/14/20  1:15 PM  Result Value Ref Range   Sodium 136 135 - 145 mmol/L   Potassium 3.9 3.5 - 5.1 mmol/L   Chloride 101 98 - 111 mmol/L   CO2 27 22 - 32 mmol/L   Glucose, Bld 106 (H) 70 - 99 mg/dL   BUN 13 6 - 20 mg/dL   Creatinine, Ser 03/16/20 0.44 - 1.00 mg/dL   Calcium 9.9 8.9 - 03/16/20 mg/dL   Total Protein 7.5 6.5 - 8.1 g/dL   Albumin 4.6 3.5 - 5.0 g/dL   AST 15 15 - 41 U/L   ALT 16 0 - 44 U/L   Alkaline Phosphatase 75 38 - 126 U/L   Total Bilirubin 1.0 0.3 - 1.2 mg/dL   GFR, Estimated 7.12 45.8 mL/min   Anion gap 8 5 - 15  Lactic acid, plasma     Status: None   Collection Time: 03/14/20  1:15 PM   Result Value Ref Range   Lactic Acid, Venous 0.6 0.5 - 1.9 mmol/L  CBC with Differential     Status: Abnormal   Collection Time: 03/14/20  1:15 PM  Result Value Ref Range   WBC 11.8 (H) 4.0 - 10.5 K/uL   RBC 4.81 3.87 - 5.11 MIL/uL   Hemoglobin 13.7 12.0 - 15.0 g/dL   HCT 03/16/20 03/16/20 - 33.8 %   MCV 86.9 80.0 - 100.0 fL   MCH 28.5 26.0 - 34.0 pg   MCHC 32.8 30.0 - 36.0 g/dL   RDW 25.0 53.9 - 76.7 %   Platelets 254 150 - 400 K/uL   nRBC 0.0 0.0 - 0.2 %   Neutrophils Relative % 67 %   Neutro Abs 8.0 (H) 1.7 - 7.7 K/uL   Lymphocytes Relative 23 %   Lymphs Abs 2.7 0.7 - 4.0 K/uL   Monocytes Relative 7 %   Monocytes Absolute 0.9 0.1 - 1.0 K/uL   Eosinophils Relative 2 %   Eosinophils Absolute 0.2 0.0 - 0.5 K/uL   Basophils Relative 1 %  Basophils Absolute 0.1 0.0 - 0.1 K/uL   Immature Granulocytes 0 %   Abs Immature Granulocytes 0.03 0.00 - 0.07 K/uL  Culture, blood (routine x 2)     Status: None (Preliminary result)   Collection Time: 03/14/20  1:15 PM   Specimen: BLOOD  Result Value Ref Range   Specimen Description BLOOD RIGHT ANTECUBITAL    Special Requests      BOTTLES DRAWN AEROBIC AND ANAEROBIC Blood Culture adequate volume   Culture      NO GROWTH < 24 HOURS Performed at [facility], [address], [place], [place] [address]    Report Status PENDING   Resp Panel by RT-PCR (Flu A&B, Covid) Nasopharyngeal Swab     Status: None   Collection Time: 03/14/20  2:47 PM   Specimen: Nasopharyngeal Swab; Nasopharyngeal(NP) swabs in vial transport medium  Result Value Ref Range   SARS Coronavirus 2 by RT PCR NEGATIVE NEGATIVE   Influenza A by PCR NEGATIVE NEGATIVE   Influenza B by PCR NEGATIVE NEGATIVE  Culture, blood (routine x 2)     Status: None (Preliminary result)   Collection Time: 03/14/20  6:50 PM   Specimen: BLOOD LEFT ARM  Result Value Ref Range   Specimen Description BLOOD LEFT ARM    Special Requests      BOTTLES DRAWN AEROBIC ONLY Blood Culture results  may not be optimal due to an inadequate volume of blood received in culture bottles   Culture      NO GROWTH < 24 HOURS Performed at [facility], [address], [place], [place] [address]    Report Status PENDING   CBC     Status: Abnormal   Collection Time: 03/15/20  3:09 AM  Result Value Ref Range   WBC 7.7 4.0 - 10.5 K/uL   RBC 4.32 3.87 - 5.11 MIL/uL   Hemoglobin 12.8 12.0 - 15.0 g/dL   HCT 02.7 25.3 - 66.4 %   MCV 85.2 80.0 - 100.0 fL   MCH 29.6 26.0 - 34.0 pg   MCHC 34.8 30.0 - 36.0 g/dL   RDW 40.3 47.4 - 25.9 %   Platelets 105 (L) 150 - 400 K/uL   nRBC 0.0 0.0 - 0.2 %  Comprehensive metabolic panel     Status: Abnormal   Collection Time: 03/15/20  6:36 AM  Result Value Ref Range   Sodium 138 135 - 145 mmol/L   Potassium 3.8 3.5 - 5.1 mmol/L   Chloride 105 98 - 111 mmol/L   CO2 26 22 - 32 mmol/L   Glucose, Bld 93 70 - 99 mg/dL   BUN 8 6 - 20 mg/dL   Creatinine, Ser 5.63 0.44 - 1.00 mg/dL   Calcium 8.9 8.9 - 87.5 mg/dL   Total Protein 5.7 (L) 6.5 - 8.1 g/dL   Albumin 3.0 (L) 3.5 - 5.0 g/dL   AST 15 15 - 41 U/L   ALT 15 0 - 44 U/L   Alkaline Phosphatase 61 38 - 126 U/L   Total Bilirubin 1.2 0.3 - 1.2 mg/dL   GFR, Estimated >64 >33 mL/min   Anion gap 7 5 - 15    DG Hand Complete Left  Result Date: 03/14/2020 CLINICAL DATA:  Cat bite EXAM: LEFT HAND - COMPLETE 3+ VIEW COMPARISON:  None. FINDINGS: Frontal, oblique, and lateral views were obtained. No fracture or dislocation. Joint spaces appear normal. No erosive change or bony destruction. No radiopaque foreign body. IMPRESSION: No fracture or  dislocation. No radiopaque foreign body. No erosive change or bony destruction. No appreciable arthropathic change. Electronically Signed   By: Bretta Bang III M.D.   On: 03/14/2020 13:24   DG Hand Complete Right  Result Date: 03/14/2020 CLINICAL DATA:  Cat bite EXAM: RIGHT HAND - COMPLETE 3+ VIEW COMPARISON:  None. FINDINGS: Frontal, oblique, and lateral views were  obtained. No fracture or dislocation. No radiopaque foreign body. No joint space narrowing or erosion. No bony destruction. IMPRESSION: No fracture or dislocation. No erosive change or bony destruction. No radiopaque foreign body. No evident arthropathy. Electronically Signed   By: Bretta Bang III M.D.   On: 03/14/2020 13:23    Assessment/Plan: Bilateral hand cellulitis after cat bites - continue splinting and sling elevator of left upper extremity - continue antibiotics per primary - I do not think she is in need of surgery at this time but she is also not ready for discharge given no significant improvement in symptoms of left hand or of erythematous streaking up left arm  Contact information:   Weekdays 8-5 Janine Ores, PA-C [phone] A fter hours and holidays please check [email or web address] for group call information for [facility]  Armida Sans 03/15/2020, 10:57 AM

## 2020-03-15 NOTE — Progress Notes (Signed)
[facility] Progress Note  Patient: Angel Ford    ZDG:[id number]  DOA: 03/14/2020     Date of Service: the patient was seen and examined on 03/15/2020  Brief hospital course: No significant past medical history. Presents with a cat bite not improving with Augmentin at home.  Currently receiving IV antibiotics.  Assessment and Plan: 1.  Cellulitis of bilateral lower extremity Cat bite Diarrhea Failed outpatient therapy with Augmentin. Initial concern was tenosynovitis although currently orthopedic surgery feels no indication for surgery or intervention. Recommend continue with IV antibiotics for now. Blood cultures so far negative. Patient reports 2 episodes of loose BM today without any blood.  Will add probiotics. Pain currently well controlled.  2.  Thrombocytopenia Acute in nature. Not sure whether this is lab error. We will recheck tomorrow and initiate further work-up as well.  3.  Obesity Placing the patient at high risk for poor outcome. Body mass index is 35.7 kg/m.   Diet: Regular diet DVT Prophylaxis:   enoxaparin (LOVENOX) injection 40 mg Start: 03/14/20 2015    Advance goals of care discussion: Full code  Family Communication: no family was present at bedside, at the time of interview.   Disposition:  Status is: Inpatient  Remains inpatient appropriate because:Inpatient level of care appropriate due to severity of illness   Dispo: The patient is from: Home              Anticipated d/c is to: Home              Patient currently is not medically stable to d/c.   Difficult to place patient No        Subjective: No nausea no vomiting.  No fever no chills.  Continues to have pain and swelling but improving.  Continues to have redness but improving from elbow area now limited only to forearm area.  Physical Exam:  General: Appear in mild distress, improving redness, no other rash; Oral Mucosa Clear, moist. no Abnormal Neck Mass Or lumps,  Conjunctiva normal  Cardiovascular: S1 and S2 Present, no Murmur, Respiratory: good respiratory effort, Bilateral Air entry present and CTA, no Crackles, no wheezes Abdomen: Bowel Sound present, Soft and no tenderness Extremities: no Pedal edema Neurology: alert and oriented to time, place, and person affect appropriate. no new focal deficit Gait not checked due to patient safety concerns  Vitals:   03/14/20 1810 03/14/20 2139 03/15/20 0633 03/15/20 1443  BP: (!) 131/94 (!) 142/82 103/72 136/79  Pulse: 67 (!) 59 67 (!) 57  Resp: 16 16 16 17   Temp: 98.2 F (36.8 C) 98.4 F (36.9 C) 98.2 F (36.8 C) 98.3 F (36.8 C)  TempSrc: Oral Oral Oral Oral  SpO2: 100% 98% 98% 100%  Weight: 88.5 kg     Height: 5\' 2"  (1.575 m)       Intake/Output Summary (Last 24 hours) at 03/15/2020 2014 Last data filed at 03/15/2020 1532 Gross per 24 hour  Intake 839.31 ml  Output --  Net 839.31 ml   Filed Weights   03/14/20 1810  Weight: 88.5 kg    Data Reviewed: I have personally reviewed and interpreted daily labs, tele strips, imaging. I reviewed all nursing notes, pharmacy notes, vitals, pertinent old records I have discussed plan of care as described above with RN and patient/family.  CBC: Recent Labs  Lab 03/14/20 1315 03/15/20 0309  WBC 11.8* 7.7  NEUTROABS 8.0*  --   HGB 13.7 12.8  HCT 41.8 36.8  MCV  86.9 85.2  PLT 254 105*   Basic Metabolic Panel: Recent Labs  Lab 03/14/20 1315 03/15/20 0636  NA 136 138  K 3.9 3.8  CL 101 105  CO2 27 26  GLUCOSE 106* 93  BUN 13 8  CREATININE 0.91 0.88  CALCIUM 9.9 8.9    Studies: No results found.  Scheduled Meds: . enoxaparin (LOVENOX) injection  40 mg Subcutaneous Q24H  . saccharomyces boulardii  250 mg Oral BID   Continuous Infusions: . ampicillin-sulbactam (UNASYN) IV 3 g (03/15/20 1430)  . doxycycline (VIBRAMYCIN) IV 125 mL/hr at 03/15/20 1532  . lactated ringers 125 mL/hr at 03/14/20 1321   PRN Meds: acetaminophen  **OR** acetaminophen, morphine injection, ondansetron **OR** ondansetron (ZOFRAN) IV  Time spent: 35 minutes  Author: Lynden Oxford, MD [facility] 03/15/2020 8:14 PM  To reach On-call, see care teams to locate the attending and reach out via [email or web address]. Between 7PM-7AM, please contact night-coverage If you still have difficulty reaching the attending provider, please page the [facility] (Director on Call) for [facility] on amion for assistance.

## 2020-03-15 NOTE — Progress Notes (Signed)
Orthopedic Tech Progress Note Patient Details:  Angel Ford Dec 31, 1960 [id number]  Ortho Devices Type of Ortho Device: Volar splint,Other (comment) Ortho Device/Splint Location: lue volar splint. elevator sling applied at drs request. Ortho Device/Splint Interventions: Ordered,Application,Adjustment   Post Interventions Patient Tolerated: Well Instructions Provided: Care of device,Adjustment of device   [name] 03/15/2020, 12:36 AM

## 2020-03-15 NOTE — Plan of Care (Signed)

## 2020-03-15 NOTE — Plan of Care (Signed)

## 2020-03-16 LAB — CULTURE, BLOOD (ROUTINE X 2)

## 2020-03-16 LAB — HIV ANTIBODY (ROUTINE TESTING W REFLEX): HIV Screen 4th Generation wRfx: NONREACTIVE

## 2020-03-16 MED ORDER — DOXYCYCLINE HYCLATE 100 MG PO TABS: 100.0000 mg | ORAL_TABLET | Freq: Two times a day (BID) | ORAL | Status: DC

## 2020-03-16 MED ORDER — DOXYCYCLINE HYCLATE 100 MG PO TABS: 100.0000 mg | ORAL_TABLET | Freq: Two times a day (BID) | ORAL | 0 refills | Status: AC

## 2020-03-16 MED ORDER — AMOXICILLIN-POT CLAVULANATE 875-125 MG PO TABS: 1.0000 | ORAL_TABLET | Freq: Two times a day (BID) | ORAL | 0 refills | Status: AC

## 2020-03-16 MED ORDER — SACCHAROMYCES BOULARDII 250 MG PO CAPS: 250.0000 mg | ORAL_CAPSULE | Freq: Two times a day (BID) | ORAL | 0 refills | Status: AC

## 2020-03-16 MED ORDER — AMOXICILLIN-POT CLAVULANATE 875-125 MG PO TABS: 1.0000 | ORAL_TABLET | Freq: Two times a day (BID) | ORAL | Status: DC

## 2020-03-16 NOTE — Plan of Care (Signed)
  Problem: Education: Goal: Knowledge of General Education information will improve Description: Including pain rating scale, medication(s)/side effects and non-pharmacologic comfort measures Outcome: Adequate for Discharge   Problem: Health Behavior/Discharge Planning: Goal: Ability to manage health-related needs will improve Outcome: Adequate for Discharge   Problem: Clinical Measurements: Goal: Ability to maintain clinical measurements within normal limits will improve Outcome: Adequate for Discharge Goal: Will remain free from infection Outcome: Adequate for Discharge Goal: Diagnostic test results will improve Outcome: Adequate for Discharge Goal: Respiratory complications will improve Outcome: Adequate for Discharge Goal: Cardiovascular complication will be avoided Outcome: Adequate for Discharge   Problem: Activity: Goal: Risk for activity intolerance will decrease Outcome: Adequate for Discharge   Problem: Nutrition: Goal: Adequate nutrition will be maintained Outcome: Adequate for Discharge   Problem: Coping: Goal: Level of anxiety will decrease Outcome: Adequate for Discharge   Problem: Elimination: Goal: Will not experience complications related to bowel motility Outcome: Adequate for Discharge Goal: Will not experience complications related to urinary retention Outcome: Adequate for Discharge   Problem: Pain Managment: Goal: General experience of comfort will improve Outcome: Adequate for Discharge   Problem: Safety: Goal: Ability to remain free from injury will improve Outcome: Adequate for Discharge   

## 2020-03-16 NOTE — Discharge Instructions (Signed)
Continue to use volar splint as needed for comfort. Use antibiotic ointment on scratches daily until follow up visit in our office in 1 week. Please call Dr. Shelba Flake office at [facility] for any questions.

## 2020-03-16 NOTE — Progress Notes (Signed)
Patient educated about discharge instructions using teach back method and verbalized proper understanding to teaching. Patient discharged to home with her nephew and left the unit with patient belongings. Patient left phone charger and was called and notified of charger being left behind. Per Lynden Oxford MD patient instructed to take next dose of Antibiotic this evening instead of prior to discharge. Patient verbalized understanding to this teaching.

## 2020-03-16 NOTE — Progress Notes (Signed)
     Subjective:    Patient states she feels like pain in left hand with movement and range of motion has improved. Left hand has no pain. No other complaints. Hoping to go home today.   Objective:  PE: VITALS:   Vitals:   03/15/20 0633 03/15/20 1443 03/15/20 1949 03/16/20 0540  BP: 103/72 136/79 135/75 (!) 114/59  Pulse: 67 (!) 57 63 (!) 55  Resp: 16 17  17   Temp: 98.2 F (36.8 C) 98.3 F (36.8 C) 98.1 F (36.7 C) 97.7 F (36.5 C)  TempSrc: Oral Oral Oral Oral  SpO2: 98% 100% 98% 97%  Weight:      Height:       General: well appearing female, laying in bed, in no acute distress MSK: No edema of right hand, mild erythema, improved since admission. Able to flex, extend, and abduct all fingers of right hand without pain. No drainage from wounds.   Continued edema and erythema of left hand, but significantly improved from yesterday. No streaking up left arm. Able to flex and extend all fingers but flexion mildly limited due to swelling. 4/5 grip strength (improved from yesterday). Distal sensation intact to all fingers of bilateral hands. Capillary refill intact to all fingers of bilateral hands.           LABS  No results found for this or any previous visit (from the past 24 hour(s)).  DG Hand Complete Left  Result Date: 03/14/2020 CLINICAL DATA:  Cat bite EXAM: LEFT HAND - COMPLETE 3+ VIEW COMPARISON:  None. FINDINGS: Frontal, oblique, and lateral views were obtained. No fracture or dislocation. Joint spaces appear normal. No erosive change or bony destruction. No radiopaque foreign body. IMPRESSION: No fracture or dislocation. No radiopaque foreign body. No erosive change or bony destruction. No appreciable arthropathic change. Electronically Signed   By: 03/16/2020 III M.D.   On: 03/14/2020 13:24   DG Hand Complete Right  Result Date: 03/14/2020 CLINICAL DATA:  Cat bite EXAM: RIGHT HAND - COMPLETE 3+ VIEW COMPARISON:  None. FINDINGS: Frontal, oblique, and  lateral views were obtained. No fracture or dislocation. No radiopaque foreign body. No joint space narrowing or erosion. No bony destruction. IMPRESSION: No fracture or dislocation. No erosive change or bony destruction. No radiopaque foreign body. No evident arthropathy. Electronically Signed   By: 03/16/2020 III M.D.   On: 03/14/2020 13:23    Assessment/Plan: Bilateral hand cellulitis after cat bites - pain, ROM, edema, and erythema has much improved bilaterally. Patient remains afebrile, WBC trending down and 7.7 today.  I think at this time she is ok to discharge home on oral antibiotics when cleared by medicine. She can continue to use her volar splint as needed for comfort. I have spoken to her about dressing her wounds with antibiotic ointment once a day at home. Plan for follow up in 1 week at our office.   Contact information:   Weekdays 8-5 09-05-2005, Janine Ores [place] A fter hours and holidays please check [email or web address] for group call information for [facility]  [phone] 03/16/2020, 9:54 AM

## 2020-03-17 LAB — CULTURE, BLOOD (ROUTINE X 2): Culture: NO GROWTH

## 2020-03-19 LAB — CULTURE, BLOOD (ROUTINE X 2)
Culture: NO GROWTH
Special Requests: ADEQUATE

## 2020-03-24 NOTE — Discharge Summary (Signed)
[facility] Discharge Summary   Patient: Angel Ford WVP:[id number]  PCP: Medicine, [facility]  Date of admission: 03/14/2020   Date of discharge: 03/16/2020     Discharge Diagnoses:  Principal Problem:   Cat bite of hand, initial encounter Active Problems:   Leucocytosis   Admitted From: home Disposition:  Home   Recommendations for Outpatient Follow-up:  1. PCP: Follow-up in 1 to 2 weeks with orthopedics 2. Follow up LABS/TEST: None 3. New Meds: Augmentin, doxycycline, Florastor   Follow-up Information    Teryl Lucy, MD. Schedule an appointment as soon as possible for a visit in 1 week(s).   Specialty: Orthopedic Surgery Contact information: [address] [place] [address] [phone]              Discharge Instructions    Diet - low sodium heart healthy   Complete by: As directed    Discharge instructions   Complete by: As directed    It is important that you read the instructions as well as go over your medication list with RN to help you understand your care after this hospitalization.  Please follow-up with PCP in 1-2 weeks.  Please note that we are unable to authorize any refills for discharge medications, once you are discharged. Thus, it is imperative that you return to your primary care physician (or establish a relationship with a primary care physician if you do not have one) for your care needs. So that they can reassess your need for medications and monitor your lab values.  Please request your primary care physician to go over all Hospital Tests and Procedure/Radiological results at the follow up. Please get all Hospital records sent to your PCP by signing hospital release before you go home.   Do not take more than prescribed Pain, Sleep and Anxiety Medications.  You were cared for by a hospitalist during your hospital stay. If you have any questions about your discharge medications or the care  you received while you were in the hospital after you are discharged, you can call the hospital unit/floor you were admitted to and ask to speak with the hospitalist who took care of you. Ask for Hospitalist on call, if the hospitalist that took care of you is not available. Once you are discharged, your primary care physician will help you with any further medical issues. You Must read complete instructions/literature along with all the possible adverse reactions/side effects for all the Medicines you take and that have been prescribed to you. Take any new Medicines after you have completely understood and accept all the possible adverse reactions/side effects.   Increase activity slowly   Complete by: As directed       Diet recommendation: Cardiac diet  Activity: The patient is advised to gradually reintroduce usual activities, as tolerated  Discharge Condition: stable  Code Status: Full code   History of present illness: As per the H and P dictated on admission, "Angel Ford is a 60 y.o. female with medical history significant of no significant past medical history who was bitten by her cat on both hands last night.  The cat bit her several times.  Patient has been up-to-date with her tetanus and the cat also has been up-to-date with her immunizations.  Most of the punctures is around the index finger knuckles and the back of both hands.  Multiple scratches also in the area.  She went to urgent care yesterday where she was given  Augmentin.  She is taking at least 3 doses so far but the swelling is getting much larger especially on the left hand is painful and stiff and not able to close her fist.  She also noted an red streak going more or less up of her left forearm and she is worried she came to the ER.  She was seen and evaluated and is admitted for IV antibiotics.  ED Course: Temperature 98.5 blood pressure 147/89, pulse 77 respirate of 19 oxygen sat 100% on room air.  White count is  11.8 the rest of the CBC is within normal.  Glucose 106 otherwise chemistry all within normal.  X-ray of the hands bilaterally showed no acute findings.  Patient being admitted for IV antibiotics. "  Hospital Course:  Summary of her active problems in the hospital is as following. 1.  Cellulitis of bilateral lower extremity Cat bite Diarrhea Failed outpatient therapy with Augmentin. Initial concern was tenosynovitis although currently orthopedic surgery feels no indication for surgery or intervention. Recommend continue with antibiotics for now. Switch to p.o. for now.  Patient will follow up with orthopedics in the outpatient. Blood cultures so far negative. Patient reports 2 episodes of loose BM today without any blood.  Will add probiotics. Pain currently well controlled.  2.  Thrombocytopenia Acute in nature. Not sure whether this is lab error. We will recheck tomorrow and initiate further work-up as well.  3.  Obesity Placing the patient at high risk for poor outcome.  Body mass index is 35.7 kg/m.  Patient was ambulatory without any assistance. On the day of the discharge the patient's vitals were stable, and no other acute medical condition were reported by patient. The patient was felt safe to be discharge at Home with no therapy needed on discharge.  Consultants: Orthopedics  Procedures: none  DISCHARGE MEDICATION: Allergies as of 03/16/2020      Reactions   Wound Dressing Adhesive Rash   Please use paper tape or non-adhesive wrap   Codeine Nausea And Vomiting      Medication List    TAKE these medications   amoxicillin-clavulanate 875-125 MG tablet Commonly known as: AUGMENTIN Take 1 tablet by mouth 2 (two) times daily for 5 days. TOTAL 10 DAY TREATMENT. Already has 5 days of Antibiotics. What changed: additional instructions   COQ10 PO Take 1 capsule by mouth in the morning.   doxycycline 100 MG tablet Commonly known as: VIBRA-TABS Take 1 tablet (100  mg total) by mouth 2 (two) times daily for 10 days.   estradiol 0.05 MG/24HR patch Commonly known as: VIVELLE-DOT Place 1 patch onto the skin 2 (two) times a week. Place one patch (0.05 mg) onto the skin twice a week - Friday morning and Monday night   FISH OIL PO Take 1 capsule by mouth 2 (two) times daily.   ibuprofen 200 MG tablet Commonly known as: ADVIL Take 400 mg by mouth daily as needed for headache (pain).   levocetirizine 5 MG tablet Commonly known as: XYZAL Take 5 mg by mouth at bedtime as needed (seasonal allergies).   multivitamin with minerals Tabs tablet Take 1 tablet by mouth in the morning.   NAC PO Take 1 capsule by mouth in the morning.   OVER THE COUNTER MEDICATION Take 1 tablet by mouth 2 (two) times daily. Non-calcium bone support   OVER THE COUNTER MEDICATION Take 2 tablets by mouth 2 (two) times daily. Herbal Stress Relief   OVER THE COUNTER MEDICATION Take 1  tablet by mouth in the morning. Plant I Oxidants   PREGNENOLONE PO Take 1 capsule by mouth in the morning.   progesterone 100 MG capsule Commonly known as: PROMETRIUM Take 200 mg by mouth at bedtime.   QUERCETIN PO Take 1 tablet by mouth 2 (two) times daily.   saccharomyces boulardii 250 MG capsule Commonly known as: FLORASTOR Take 1 capsule (250 mg total) by mouth 2 (two) times daily for 15 days. What changed:   medication strength  how much to take  when to take this   SUDAFED PO Take 1 tablet by mouth 2 (two) times daily as needed (congestion).   Testosterone Micronized Powd Apply 1 application topically See admin instructions. Low Dose Testosterone Cream compounded by [facility] - apply topically to inside of thighs every morning   THERATEARS OP Place 1 drop into both eyes at bedtime.   Turmeric 500 MG Caps Take 500 mg by mouth 2 (two) times daily.   VITAMIN C PO Take 1 tablet by mouth 2 (two) times daily.   Vitamin D-3 125 MCG (5000 UT) Tabs Take  5,000 Units by mouth in the morning.   Zinc 50 MG Tabs Take 50 mg by mouth at bedtime.       Discharge Exam: Filed Weights   03/14/20 1810  Weight: 88.5 kg   Vitals:   03/15/20 1949 03/16/20 0540  BP: 135/75 (!) 114/59  Pulse: 63 (!) 55  Resp:  17  Temp: 98.1 F (36.7 C) 97.7 F (36.5 C)  SpO2: 98% 97%   General: Appear in no distress, no Rash; Oral Mucosa Clear, moist. no Abnormal Neck Mass Or lumps, Conjunctiva normal  Cardiovascular: S1 and S2 Present, no Murmur Respiratory: good respiratory effort, Bilateral Air entry present and CTA, no Crackles, no wheezes Abdomen: Bowel Sound present, Soft and no tenderness Extremities: no Pedal edema, improving redness at the cat bite area Neurology: alert and oriented to time, place, and person affect appropriate. no new focal deficit  The results of significant diagnostics from this hospitalization (including imaging, microbiology, ancillary and laboratory) are listed below for reference.    Significant Diagnostic Studies: DG Hand Complete Left  Result Date: 03/14/2020 CLINICAL DATA:  Cat bite EXAM: LEFT HAND - COMPLETE 3+ VIEW COMPARISON:  None. FINDINGS: Frontal, oblique, and lateral views were obtained. No fracture or dislocation. Joint spaces appear normal. No erosive change or bony destruction. No radiopaque foreign body. IMPRESSION: No fracture or dislocation. No radiopaque foreign body. No erosive change or bony destruction. No appreciable arthropathic change. Electronically Signed   By: Bretta Bang III M.D.   On: 03/14/2020 13:24   DG Hand Complete Right  Result Date: 03/14/2020 CLINICAL DATA:  Cat bite EXAM: RIGHT HAND - COMPLETE 3+ VIEW COMPARISON:  None. FINDINGS: Frontal, oblique, and lateral views were obtained. No fracture or dislocation. No radiopaque foreign body. No joint space narrowing or erosion. No bony destruction. IMPRESSION: No fracture or dislocation. No erosive change or bony destruction. No radiopaque  foreign body. No evident arthropathy. Electronically Signed   By: Bretta Bang III M.D.   On: 03/14/2020 13:23    Microbiology: Recent Results (from the past 240 hour(s))  Culture, blood (routine x 2)     Status: None   Collection Time: 03/14/20  6:50 PM   Specimen: BLOOD LEFT ARM  Result Value Ref Range Status   Specimen Description BLOOD LEFT ARM  Final   Special Requests   Final    BOTTLES DRAWN  AEROBIC ONLY Blood Culture results may not be optimal due to an inadequate volume of blood received in culture bottles   Culture   Final    NO GROWTH 5 DAYS Performed at [facility], [address] [place], [place] [address]    Report Status 03/19/2020 FINAL  Final     Labs: CBC: No results for input(s): WBC, NEUTROABS, HGB, HCT, MCV, PLT in the last 168 hours. Basic Metabolic Panel: No results for input(s): NA, K, CL, CO2, GLUCOSE, BUN, CREATININE, CALCIUM, MG, PHOS in the last 168 hours. Liver Function Tests: No results for input(s): AST, ALT, ALKPHOS, BILITOT, PROT, ALBUMIN in the last 168 hours. CBG: No results for input(s): GLUCAP in the last 168 hours.  Time spent: 35 minutes  Signed:  Lynden Oxfordranav Kevyn Wengert  [facility] 03/16/2020 6:43 PM

## 2020-04-27 ENCOUNTER — Ambulatory Visit: Payer: Self-pay

## 2020-06-15 ENCOUNTER — Ambulatory Visit
Admission: RE | Admit: 2020-06-15 | Discharge: 2020-06-15 | Disposition: A | Discharge status: Home / Self Care | Payer: Self-pay | Source: Ambulatory Visit | Attending: Obstetrics & Gynecology | Admitting: Obstetrics & Gynecology

## 2020-06-15 ENCOUNTER — Other Ambulatory Visit: Payer: Self-pay

## 2020-06-15 DIAGNOSIS — Z1231 Encounter for screening mammogram for malignant neoplasm of breast: Secondary | ICD-10-CM

## 2021-05-06 ENCOUNTER — Other Ambulatory Visit: Payer: Self-pay | Admitting: Obstetrics & Gynecology

## 2021-05-06 DIAGNOSIS — Z1231 Encounter for screening mammogram for malignant neoplasm of breast: Secondary | ICD-10-CM

## 2021-06-16 ENCOUNTER — Ambulatory Visit
Admission: RE | Admit: 2021-06-16 | Discharge: 2021-06-16 | Disposition: A | Discharge status: Home / Self Care | Payer: BC Managed Care – PPO | Source: Ambulatory Visit | Attending: Obstetrics & Gynecology | Admitting: Obstetrics & Gynecology

## 2021-06-16 DIAGNOSIS — Z1231 Encounter for screening mammogram for malignant neoplasm of breast: Secondary | ICD-10-CM

## 2023-11-07 IMAGING — MG MM DIGITAL SCREENING BILAT W/ TOMO AND CAD
8 series · 8 of 24 positions shown · non-contrast
Comparison: Previous exam(s).

CLINICAL DATA: Screening.

EXAM:
DIGITAL SCREENING BILATERAL MAMMOGRAM WITH TOMOSYNTHESIS AND CAD
TECHNIQUE: Bilateral screening digital craniocaudal and mediolateral oblique
mammograms were obtained. Bilateral screening digital breast
tomosynthesis was performed. The images were evaluated with
computer-aided detection.

[L CC synth-2D]
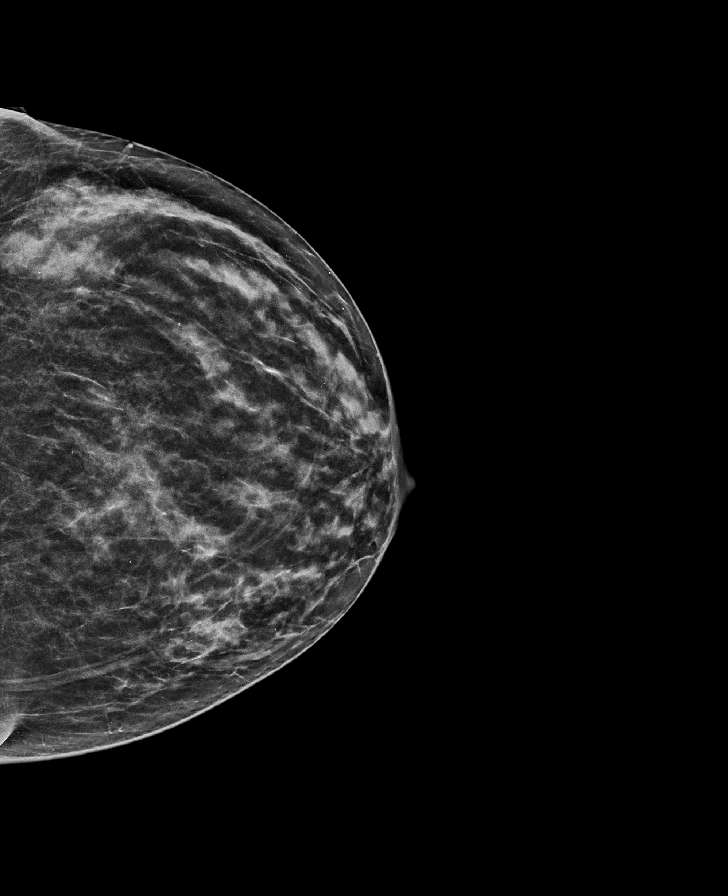

[R CC synth-2D]
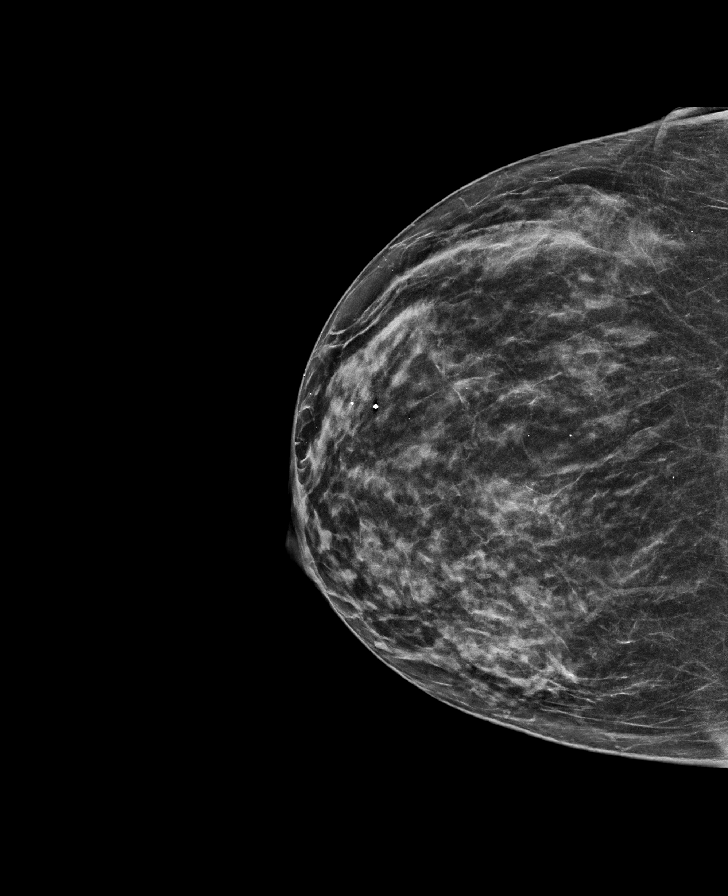

[R MLO synth-2D]
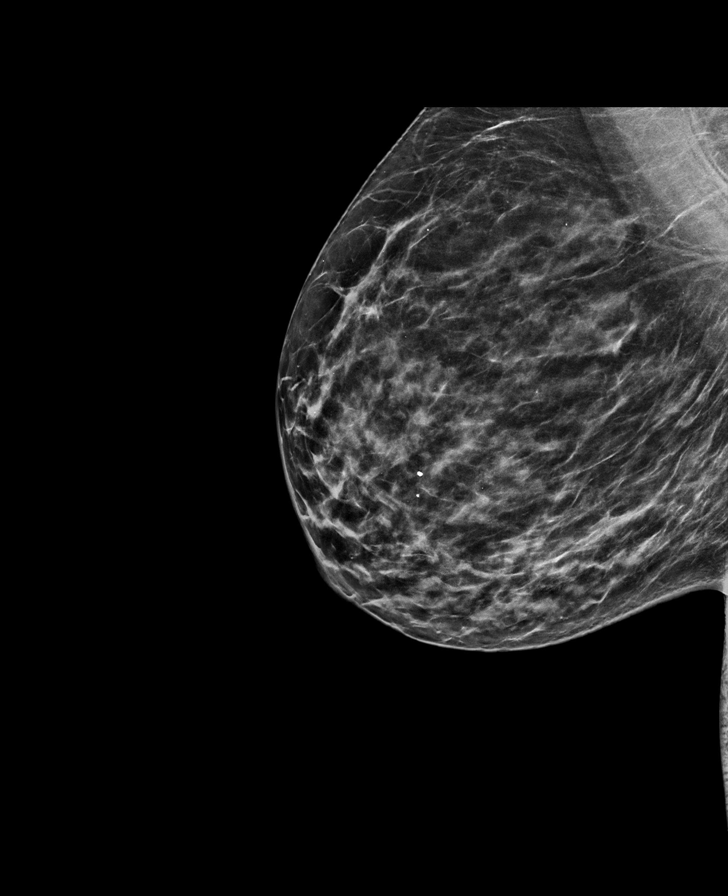

[L MLO synth-2D]
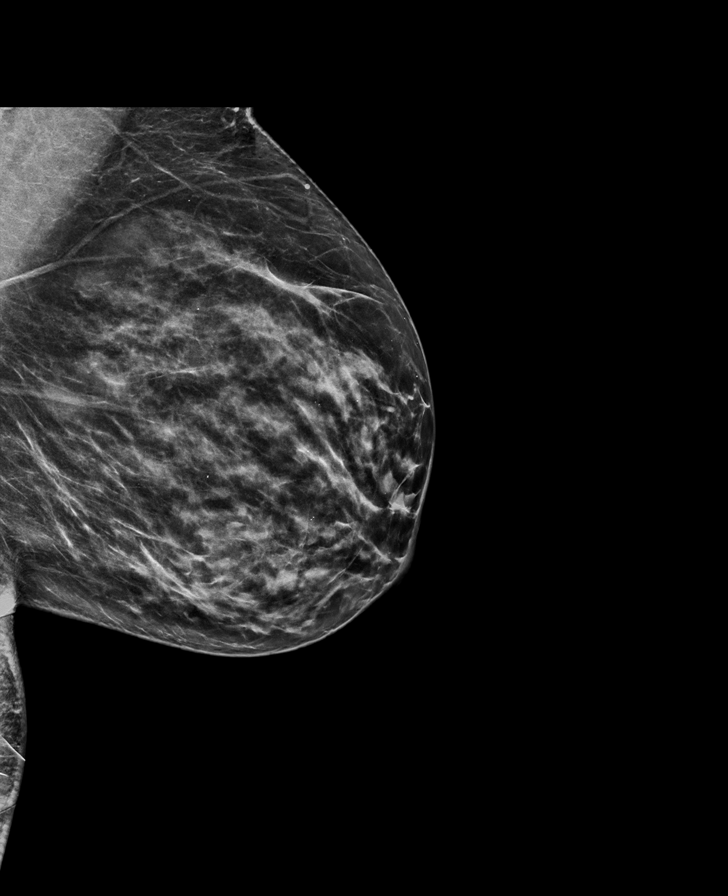

[L MLO tomo · tomo slice 33/65.0]
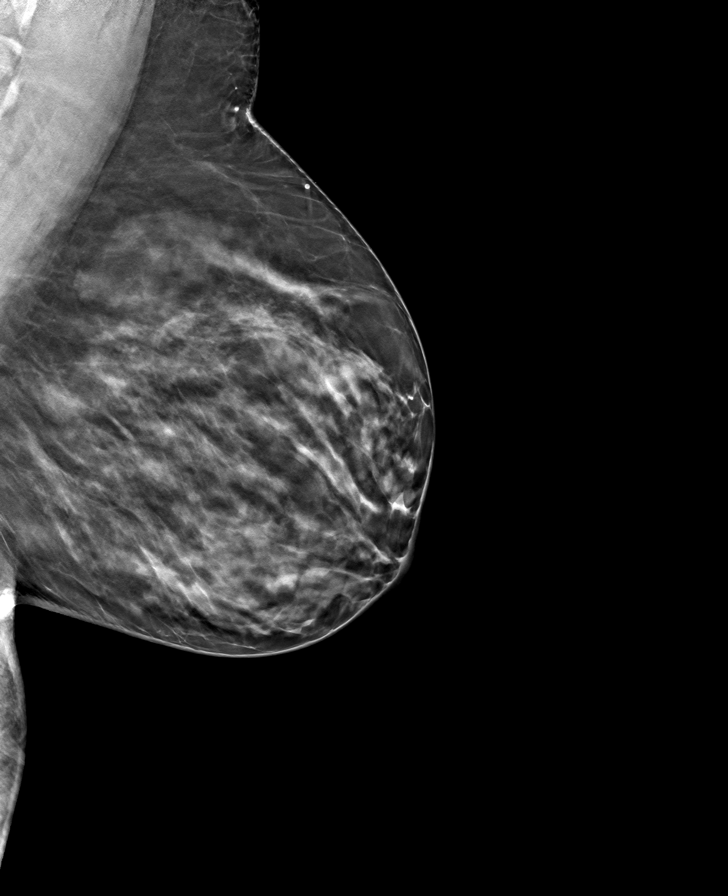

[R CC tomo · tomo slice 31/60.0]
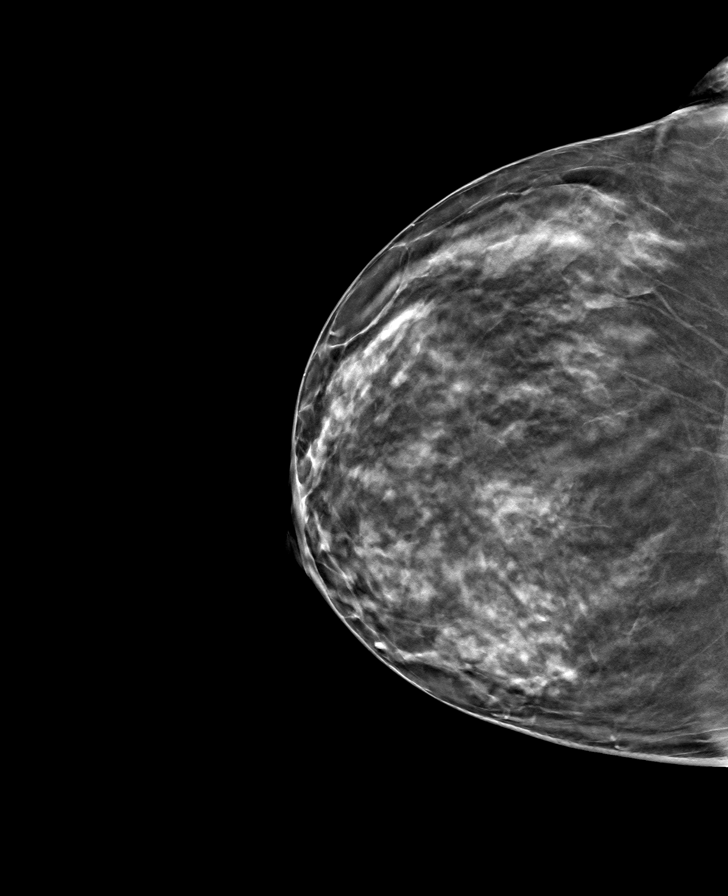

[R MLO tomo · tomo slice 33/64.0]
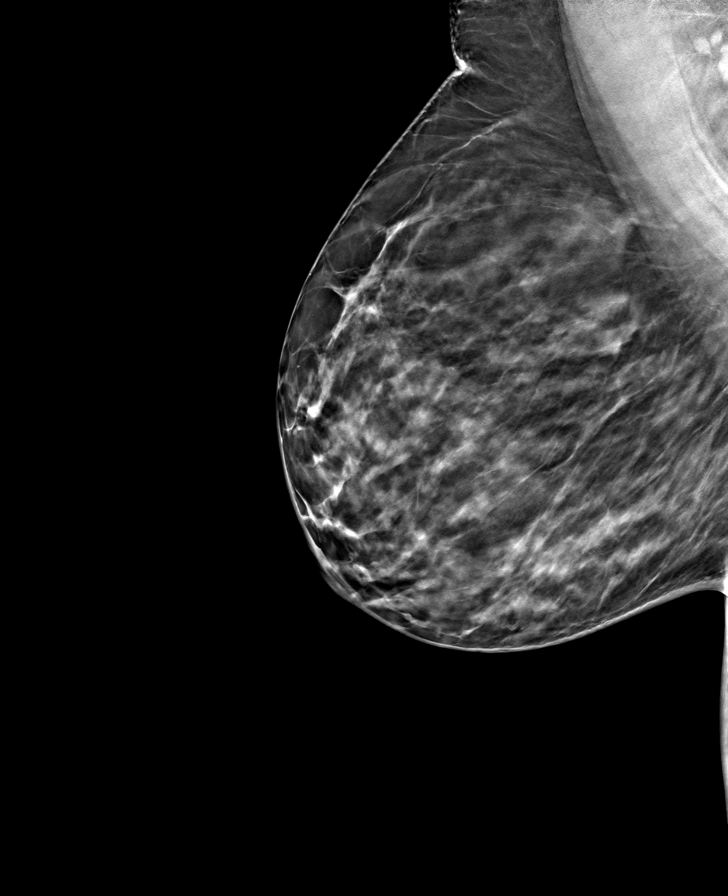

[L CC tomo · tomo slice 27/53.0]
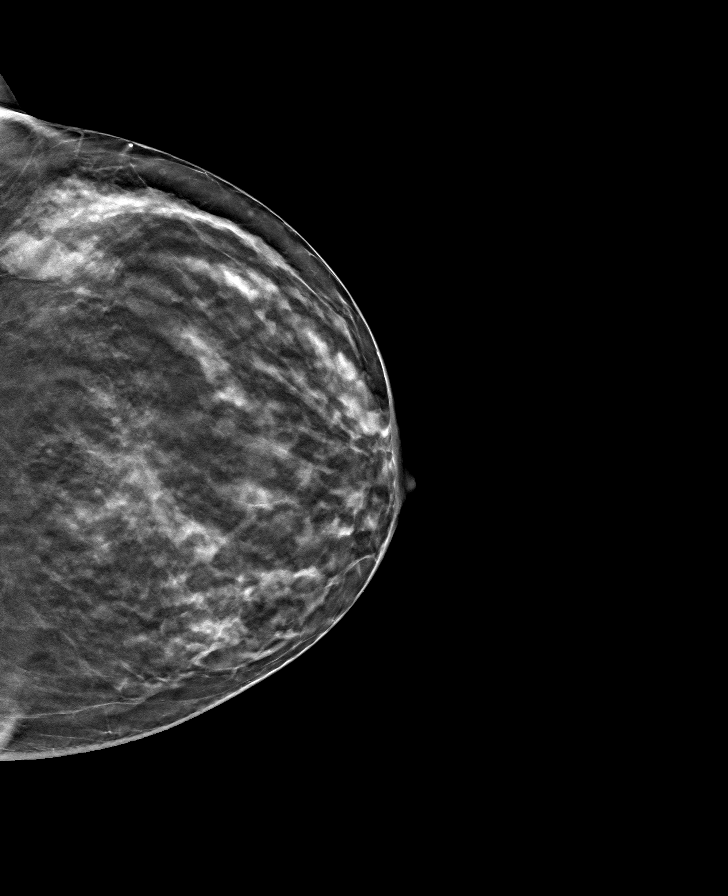

[8 of 24 positions shown; findings below may reference images not displayed]

ACR Breast Density Category c: The breast tissue is heterogeneously
dense, which may obscure small masses.
FINDINGS: There are no findings suspicious for malignancy.
IMPRESSION: No mammographic evidence of malignancy. A result letter of this
screening mammogram will be mailed directly to the patient.

RECOMMENDATION:
Screening mammogram in one year. (Code:Q3-W-BC3)

BI-RADS CATEGORY  1: Negative.

## 2023-11-15 ENCOUNTER — Other Ambulatory Visit: Payer: Self-pay | Admitting: Obstetrics & Gynecology

## 2023-11-15 DIAGNOSIS — Z1231 Encounter for screening mammogram for malignant neoplasm of breast: Secondary | ICD-10-CM

## 2023-12-11 ENCOUNTER — Ambulatory Visit
Admission: RE | Admit: 2023-12-11 | Discharge: 2023-12-11 | Disposition: A | Source: Ambulatory Visit | Attending: Obstetrics & Gynecology | Admitting: Obstetrics & Gynecology

## 2023-12-11 DIAGNOSIS — Z1231 Encounter for screening mammogram for malignant neoplasm of breast: Secondary | ICD-10-CM

## 2023-12-13 ENCOUNTER — Other Ambulatory Visit: Payer: Self-pay | Admitting: Medical Genetics

## 2024-01-29 ENCOUNTER — Other Ambulatory Visit: Payer: Self-pay

## 2024-02-14 ENCOUNTER — Other Ambulatory Visit: Payer: Self-pay
